# Patient Record
Sex: Female | Born: 1985 | Race: White | Hispanic: No | Marital: Married | Smoking: Never smoker
Health system: Southern US, Community
[De-identification: ages and names within clinical notes are randomized; demographics above are authoritative.]

## PROBLEM LIST (undated history)

## (undated) DIAGNOSIS — R87619 Unspecified abnormal cytological findings in specimens from cervix uteri: Secondary | ICD-10-CM

## (undated) DIAGNOSIS — F419 Anxiety disorder, unspecified: Secondary | ICD-10-CM

## (undated) DIAGNOSIS — K219 Gastro-esophageal reflux disease without esophagitis: Secondary | ICD-10-CM

## (undated) DIAGNOSIS — K859 Acute pancreatitis without necrosis or infection, unspecified: Secondary | ICD-10-CM

## (undated) HISTORY — PX: TONSILLECTOMY: SUR1361

## (undated) HISTORY — DX: Unspecified abnormal cytological findings in specimens from cervix uteri: R87.619

## (undated) HISTORY — PX: WISDOM TOOTH EXTRACTION: SHX21

---

## 2012-11-06 NOTE — L&D Delivery Note (Signed)
Operative Delivery Note At 2:08 PM a viable female was delivered via Vaginal, Vacuum Investment banker, operational).  Presentation: vertex; Position: Left,, Occiput,, Anterior; Station: +2.  Verbal consent: obtained from patient.  Risks and benefits discussed in detail.  Risks include, but are not limited to the risks of anesthesia, bleeding, infection, damage to maternal tissues, fetal cephalhematoma.  There is also the risk of inability to effect vaginal delivery of the head, or shoulder dystocia that cannot be resolved by established maneuvers, leading to the need for emergency cesarean section.  APGAR:8/9 , ; weight .   Placenta status: , .intact   Cord: 3 vessels with the following complications: None.  Cord pH: na  Anesthesia: Epidural  Instruments: mighty vac mushroom cup Episiotomy: None Lacerations: left lateral vaginal laceration Suture Repair: 2.0 chromic Est. Blood Loss (mL): 450  Mom to postpartum.  Baby to nursery-stable.  Briar Sword S 07/31/2013, 2:22 PM

## 2013-07-24 ENCOUNTER — Inpatient Hospital Stay (HOSPITAL_COMMUNITY)
Admission: AD | Admit: 2013-07-24 | Discharge: 2013-07-24 | Disposition: A | Discharge status: Home / Self Care | Payer: BC Managed Care – PPO | Source: Ambulatory Visit | Attending: Obstetrics and Gynecology | Admitting: Obstetrics and Gynecology

## 2013-07-24 ENCOUNTER — Encounter (HOSPITAL_COMMUNITY): Payer: Self-pay | Admitting: *Deleted

## 2013-07-24 DIAGNOSIS — O133 Gestational [pregnancy-induced] hypertension without significant proteinuria, third trimester: Secondary | ICD-10-CM

## 2013-07-24 DIAGNOSIS — O139 Gestational [pregnancy-induced] hypertension without significant proteinuria, unspecified trimester: Secondary | ICD-10-CM | POA: Insufficient documentation

## 2013-07-24 DIAGNOSIS — E876 Hypokalemia: Secondary | ICD-10-CM | POA: Insufficient documentation

## 2013-07-24 HISTORY — DX: Acute pancreatitis without necrosis or infection, unspecified: K85.90

## 2013-07-24 LAB — COMPREHENSIVE METABOLIC PANEL
ALT: 18 U/L (ref 0–35)
AST: 22 U/L (ref 0–37)
Albumin: 3 g/dL — ABNORMAL LOW (ref 3.5–5.2)
Alkaline Phosphatase: 111 U/L (ref 39–117)
BUN: 3 mg/dL — ABNORMAL LOW (ref 6–23)
Chloride: 99 mEq/L (ref 96–112)
Potassium: 3 mEq/L — ABNORMAL LOW (ref 3.5–5.1)
Sodium: 136 mEq/L (ref 135–145)
Total Protein: 6.2 g/dL (ref 6.0–8.3)

## 2013-07-24 LAB — URINALYSIS, ROUTINE W REFLEX MICROSCOPIC
Ketones, ur: 15 mg/dL — AB
Nitrite: NEGATIVE
Protein, ur: NEGATIVE mg/dL
Urobilinogen, UA: 0.2 mg/dL (ref 0.0–1.0)

## 2013-07-24 LAB — CBC
MCHC: 34.7 g/dL (ref 30.0–36.0)
Platelets: 196 10*3/uL (ref 150–400)
RDW: 13.9 % (ref 11.5–15.5)
WBC: 16.7 10*3/uL — ABNORMAL HIGH (ref 4.0–10.5)

## 2013-07-24 LAB — URINE MICROSCOPIC-ADD ON

## 2013-07-24 LAB — LACTATE DEHYDROGENASE: LDH: 202 U/L (ref 94–250)

## 2013-07-24 NOTE — MAU Provider Note (Signed)
History     CSN: [id number]  Arrival date and time: 07/24/13 1610   First Provider Initiated Contact with Patient 07/24/13 1713      Chief Complaint  Patient presents with  . Hypertension   HPI  Ms. Vickie Olson is a 27 y.o. female G1P0 at [redacted]w[redacted]d who presents for high blood pressure readings in the office today; she was sent over by Dr. Henderson Cloud for evaluation and lab work. She is healthy; no problems with this pregnancy. She denies headaches, right upper quadrant pain or lower extremity swelling. She is having some contractions; she was checked in the office today and her cervix was a FT.  She has had a few episodes of diarrhea and was told it was likely related to contractions or the start of labor. She reports good fetal movement, denies LOF, vaginal bleeding, vaginal itching/burning, urinary symptoms, h/a, dizziness, n/v, or fever/chills.     OB History   Grav Para Term Preterm Abortions TAB SAB Ect Mult Living   1         0      Past Medical History  Diagnosis Date  . Pancreatitis, acute     Past Surgical History  Procedure Laterality Date  . Tonsillectomy      History reviewed. No pertinent family history.  History  Substance Use Topics  . Smoking status: Never Smoker   . Smokeless tobacco: Not on file  . Alcohol Use: No    Allergies:  Allergies  Allergen Reactions  . Terconazole Other (See Comments)    Cannot take because patient has had pancreatitis  . Augmentin [Amoxicillin-Pot Clavulanate] Diarrhea    Prescriptions prior to admission  Medication Sig Dispense Refill  . acetaminophen (TYLENOL) 500 MG tablet Take 500 mg by mouth every 6 (six) hours as needed for pain.      . Prenatal Vit-Fe Fumarate-FA (PRENATAL MULTIVITAMIN) TABS tablet Take 1 tablet by mouth at bedtime.       Results for orders placed during the hospital encounter of 07/24/13 (from the past 24 hour(s))  URINALYSIS, ROUTINE W REFLEX MICROSCOPIC     Status: Abnormal   Collection Time     07/24/13  4:25 PM      Result Value Range   Color, Urine STRAW (*) YELLOW   APPearance CLEAR  CLEAR   Specific Gravity, Urine 1.010  1.005 - 1.030   pH 7.0  5.0 - 8.0   Glucose, UA NEGATIVE  NEGATIVE mg/dL   Hgb urine dipstick SMALL (*) NEGATIVE   Bilirubin Urine NEGATIVE  NEGATIVE   Ketones, ur 15 (*) NEGATIVE mg/dL   Protein, ur NEGATIVE  NEGATIVE mg/dL   Urobilinogen, UA 0.2  0.0 - 1.0 mg/dL   Nitrite NEGATIVE  NEGATIVE   Leukocytes, UA MODERATE (*) NEGATIVE  URINE MICROSCOPIC-ADD ON     Status: Abnormal   Collection Time    07/24/13  4:25 PM      Result Value Range   Squamous Epithelial / LPF FEW (*) RARE   WBC, UA 11-20  <3 WBC/hpf   Bacteria, UA FEW (*) RARE  CBC     Status: Abnormal   Collection Time    07/24/13  4:52 PM      Result Value Range   WBC 16.7 (*) 4.0 - 10.5 K/uL   RBC 4.16  3.87 - 5.11 MIL/uL   Hemoglobin 12.0  12.0 - 15.0 g/dL   HCT 78.2 (*) 95.6 - 21.3 %   MCV 83.2  78.0 - 100.0 fL   MCH 28.8  26.0 - 34.0 pg   MCHC 34.7  30.0 - 36.0 g/dL   RDW 16.1  09.6 - 04.5 %   Platelets 196  150 - 400 K/uL  COMPREHENSIVE METABOLIC PANEL     Status: Abnormal   Collection Time    07/24/13  4:52 PM      Result Value Range   Sodium 136  135 - 145 mEq/L   Potassium 3.0 (*) 3.5 - 5.1 mEq/L   Chloride 99  96 - 112 mEq/L   CO2 23  19 - 32 mEq/L   Glucose, Bld 81  70 - 99 mg/dL   BUN 3 (*) 6 - 23 mg/dL   Creatinine, Ser 4.09  0.50 - 1.10 mg/dL   Calcium 9.7  8.4 - 81.1 mg/dL   Total Protein 6.2  6.0 - 8.3 g/dL   Albumin 3.0 (*) 3.5 - 5.2 g/dL   AST 22  0 - 37 U/L   ALT 18  0 - 35 U/L   Alkaline Phosphatase 111  39 - 117 U/L   Total Bilirubin 0.4  0.3 - 1.2 mg/dL   GFR calc non Af Amer >90  >90 mL/min   GFR calc Af Amer >90  >90 mL/min  LACTATE DEHYDROGENASE     Status: None   Collection Time    07/24/13  4:52 PM      Result Value Range   LDH 202  94 - 250 U/L  URIC ACID     Status: None   Collection Time    07/24/13  4:52 PM      Result Value Range    Uric Acid, Serum 3.5  2.4 - 7.0 mg/dL    Review of Systems  Constitutional: Negative for fever, chills and weight loss.  Respiratory: Negative for shortness of breath.   Gastrointestinal: Positive for abdominal pain and diarrhea. Negative for nausea, vomiting and constipation.       Some upper abdominal tightening   Genitourinary: Negative for dysuria, urgency and frequency.   Physical Exam   Blood pressure 137/82, pulse 89, temperature 98.4 F (36.9 C), temperature source Oral, resp. rate 20, height 5\' 8"  (1.727 m), weight 75.297 kg (166 lb), SpO2 99.00%.  Physical Exam  Constitutional: She is oriented to person, place, and time. She appears well-developed and well-nourished. No distress.  Eyes: Pupils are equal, round, and reactive to light.  Neck: Neck supple.  Cardiovascular: Normal rate, regular rhythm and normal heart sounds.   Respiratory: Breath sounds normal. No respiratory distress.  GI: Soft. There is no tenderness. There is no rebound and no guarding.  No right upper abdominal pain on palpation   Musculoskeletal: She exhibits no edema and no tenderness.       Right ankle: Normal. She exhibits no swelling.       Left ankle: Normal. She exhibits no swelling.  Neurological: She is alert and oriented to person, place, and time. She has normal reflexes.  Skin: Skin is warm and dry. She is not diaphoretic.   Fetal Tracing: Baseline: 140 bpm Variability: Moderate  Accelerations: 15X15 Decelerations: none  Toco: Irregular contraction patter, UI   MAU Course  Procedures None  MDM NST CBC CMET Uric Acid LDH UA Urine culture sent  Consulted with Dr. Marcelle Overlie regarding patients lab work; ok to discharge pt home with PIH precautions.   Assessment and Plan  A: Pregnancy induced hypertension Hypokalemia   P: Discharge home Increase your  water intake; Gatorade to increase K+ levels B.R.A.T diet discussed  Labor precautions discussed  Kick counts discussed   Return to MAU if symptoms worsen Keep your regular scheduled appointment  [facility], Deziree Mokry IRENE FNP-C 07/24/2013, 8:16 PM

## 2013-07-24 NOTE — MAU Note (Signed)
Pt saw Dr. Henderson Cloud in office today and had elevated BP.  Pt sent from office for BP monitoring.

## 2013-07-26 LAB — URINE CULTURE

## 2013-07-30 ENCOUNTER — Inpatient Hospital Stay (HOSPITAL_COMMUNITY)
Admission: AD | Admit: 2013-07-30 | Discharge: 2013-07-31 | Disposition: A | Discharge status: Home / Self Care | Payer: BC Managed Care – PPO | Source: Ambulatory Visit | Attending: Obstetrics and Gynecology | Admitting: Obstetrics and Gynecology

## 2013-07-30 ENCOUNTER — Encounter (HOSPITAL_COMMUNITY): Payer: Self-pay | Admitting: *Deleted

## 2013-07-30 DIAGNOSIS — O479 False labor, unspecified: Secondary | ICD-10-CM | POA: Insufficient documentation

## 2013-07-30 LAB — OB RESULTS CONSOLE GBS: GBS: NEGATIVE

## 2013-07-30 NOTE — MAU Note (Signed)
Contractions every 5 minutes tonight. Denies leaking of fluid or vaginal bleeding. Positive fetal movement. Dilate 1.5/90/BBOW in office on Monday.

## 2013-07-31 ENCOUNTER — Inpatient Hospital Stay (HOSPITAL_COMMUNITY)
Admission: AD | Admit: 2013-07-31 | Discharge: 2013-08-02 | DRG: 373 | Disposition: A | Discharge status: Home / Self Care | Payer: BC Managed Care – PPO | Source: Ambulatory Visit | Attending: Obstetrics and Gynecology | Admitting: Obstetrics and Gynecology

## 2013-07-31 ENCOUNTER — Encounter (HOSPITAL_COMMUNITY): Payer: Self-pay | Admitting: Anesthesiology

## 2013-07-31 ENCOUNTER — Inpatient Hospital Stay (HOSPITAL_COMMUNITY): Payer: BC Managed Care – PPO | Admitting: Anesthesiology

## 2013-07-31 ENCOUNTER — Encounter (HOSPITAL_COMMUNITY): Payer: Self-pay | Admitting: *Deleted

## 2013-07-31 DIAGNOSIS — S3141XA Laceration without foreign body of vagina and vulva, initial encounter: Secondary | ICD-10-CM

## 2013-07-31 LAB — CBC
HCT: 36.1 % (ref 36.0–46.0)
Hemoglobin: 12 g/dL (ref 12.0–15.0)
Hemoglobin: 12.5 g/dL (ref 12.0–15.0)
MCHC: 34.6 g/dL (ref 30.0–36.0)
MCV: 82.8 fL (ref 78.0–100.0)
RBC: 4.24 MIL/uL (ref 3.87–5.11)
WBC: 23.3 10*3/uL — ABNORMAL HIGH (ref 4.0–10.5)

## 2013-07-31 LAB — OB RESULTS CONSOLE RPR
RPR: NONREACTIVE
RPR: NONREACTIVE
RPR: NONREACTIVE
RPR: NONREACTIVE

## 2013-07-31 LAB — URINALYSIS, ROUTINE W REFLEX MICROSCOPIC
Bilirubin Urine: NEGATIVE
Glucose, UA: NEGATIVE mg/dL
Nitrite: NEGATIVE
Specific Gravity, Urine: 1.01 (ref 1.005–1.030)
pH: 7 (ref 5.0–8.0)

## 2013-07-31 LAB — COMPREHENSIVE METABOLIC PANEL
ALT: 16 U/L (ref 0–35)
Alkaline Phosphatase: 113 U/L (ref 39–117)
CO2: 21 mEq/L (ref 19–32)
GFR calc Af Amer: >90 mL/min (ref >90)
GFR calc non Af Amer: >90 mL/min (ref >90)
Glucose, Bld: 90 mg/dL (ref 70–99)
Potassium: 3 mEq/L — ABNORMAL LOW (ref 3.5–5.1)
Sodium: 134 mEq/L — ABNORMAL LOW (ref 135–145)
Total Bilirubin: 0.4 mg/dL (ref 0.3–1.2)

## 2013-07-31 LAB — OB RESULTS CONSOLE RUBELLA ANTIBODY, IGM: Rubella: IMMUNE

## 2013-07-31 MED ORDER — FENTANYL 2.5 MCG/ML BUPIVACAINE 1/10 % EPIDURAL INFUSION (WH - ANES)
14.0000 mL/h | INTRAMUSCULAR | Status: DC | PRN
  Filled 2013-07-31: qty 125

## 2013-07-31 MED ORDER — LACTATED RINGERS IV SOLN
500.0000 mL | Freq: Once | INTRAVENOUS | Status: AC
  Administered 2013-07-31: 500 mL via INTRAVENOUS

## 2013-07-31 MED ORDER — FLEET ENEMA 7-19 GM/118ML RE ENEM: 1.0000 | ENEMA | Freq: Every day | RECTAL | Status: DC | PRN

## 2013-07-31 MED ORDER — IBUPROFEN 600 MG PO TABS
600.0000 mg | ORAL_TABLET | Freq: Four times a day (QID) | ORAL | Status: DC
  Administered 2013-07-31 – 2013-08-02 (×7): 600 mg via ORAL
  Filled 2013-07-31 (×7): qty 1

## 2013-07-31 MED ORDER — PHENYLEPHRINE 40 MCG/ML (10ML) SYRINGE FOR IV PUSH (FOR BLOOD PRESSURE SUPPORT)
80.0000 ug | PREFILLED_SYRINGE | INTRAVENOUS | Status: DC | PRN
  Administered 2013-07-31: 40 ug via INTRAVENOUS
  Filled 2013-07-31: qty 2

## 2013-07-31 MED ORDER — LIDOCAINE HCL (PF) 1 % IJ SOLN
30.0000 mL | INTRAMUSCULAR | Status: DC | PRN
  Administered 2013-07-31: 30 mL via SUBCUTANEOUS
  Filled 2013-07-31 (×2): qty 30

## 2013-07-31 MED ORDER — TETANUS-DIPHTH-ACELL PERTUSSIS 5-2.5-18.5 LF-MCG/0.5 IM SUSP: 0.5000 mL | Freq: Once | INTRAMUSCULAR | Status: DC

## 2013-07-31 MED ORDER — WITCH HAZEL-GLYCERIN EX PADS: 1.0000 "application " | MEDICATED_PAD | CUTANEOUS | Status: DC | PRN

## 2013-07-31 MED ORDER — LACTATED RINGERS IV SOLN
INTRAVENOUS | Status: DC
  Administered 2013-07-31 (×2): via INTRAVENOUS

## 2013-07-31 MED ORDER — ONDANSETRON HCL 4 MG/2ML IJ SOLN: 4.0000 mg | INTRAMUSCULAR | Status: DC | PRN

## 2013-07-31 MED ORDER — OXYTOCIN BOLUS FROM INFUSION: 500.0000 mL | INTRAVENOUS | Status: DC

## 2013-07-31 MED ORDER — OXYTOCIN 40 UNITS IN LACTATED RINGERS INFUSION - SIMPLE MED
62.5000 mL/h | INTRAVENOUS | Status: DC
  Administered 2013-07-31: 62.5 mL/h via INTRAVENOUS
  Filled 2013-07-31: qty 1000

## 2013-07-31 MED ORDER — LACTATED RINGERS IV SOLN: 500.0000 mL | INTRAVENOUS | Status: DC | PRN

## 2013-07-31 MED ORDER — OXYTOCIN 40 UNITS IN LACTATED RINGERS INFUSION - SIMPLE MED
1.0000 m[IU]/min | INTRAVENOUS | Status: DC
  Administered 2013-07-31: 2 m[IU]/min via INTRAVENOUS
  Administered 2013-07-31: 4 m[IU]/min via INTRAVENOUS

## 2013-07-31 MED ORDER — OXYCODONE-ACETAMINOPHEN 5-325 MG PO TABS: 1.0000 | ORAL_TABLET | ORAL | Status: DC | PRN

## 2013-07-31 MED ORDER — ONDANSETRON HCL 4 MG PO TABS: 4.0000 mg | ORAL_TABLET | ORAL | Status: DC | PRN

## 2013-07-31 MED ORDER — DIPHENHYDRAMINE HCL 25 MG PO CAPS: 25.0000 mg | ORAL_CAPSULE | Freq: Four times a day (QID) | ORAL | Status: DC | PRN

## 2013-07-31 MED ORDER — BISACODYL 10 MG RE SUPP: 10.0000 mg | Freq: Every day | RECTAL | Status: DC | PRN

## 2013-07-31 MED ORDER — EPHEDRINE 5 MG/ML INJ
10.0000 mg | INTRAVENOUS | Status: DC | PRN
  Filled 2013-07-31: qty 2

## 2013-07-31 MED ORDER — LANOLIN HYDROUS EX OINT: TOPICAL_OINTMENT | CUTANEOUS | Status: DC | PRN

## 2013-07-31 MED ORDER — ZOLPIDEM TARTRATE 5 MG PO TABS: 5.0000 mg | ORAL_TABLET | Freq: Every evening | ORAL | Status: DC | PRN

## 2013-07-31 MED ORDER — PRENATAL MULTIVITAMIN CH
1.0000 | ORAL_TABLET | Freq: Every day | ORAL | Status: DC
  Administered 2013-08-01: 1 via ORAL
  Filled 2013-07-31: qty 1

## 2013-07-31 MED ORDER — EPHEDRINE 5 MG/ML INJ
10.0000 mg | INTRAVENOUS | Status: DC | PRN
  Filled 2013-07-31: qty 4
  Filled 2013-07-31: qty 2

## 2013-07-31 MED ORDER — CITRIC ACID-SODIUM CITRATE 334-500 MG/5ML PO SOLN: 30.0000 mL | ORAL | Status: DC | PRN

## 2013-07-31 MED ORDER — PHENYLEPHRINE 40 MCG/ML (10ML) SYRINGE FOR IV PUSH (FOR BLOOD PRESSURE SUPPORT)
80.0000 ug | PREFILLED_SYRINGE | INTRAVENOUS | Status: DC | PRN
  Filled 2013-07-31: qty 2
  Filled 2013-07-31: qty 5

## 2013-07-31 MED ORDER — ONDANSETRON HCL 4 MG/2ML IJ SOLN: 4.0000 mg | Freq: Four times a day (QID) | INTRAMUSCULAR | Status: DC | PRN

## 2013-07-31 MED ORDER — IBUPROFEN 600 MG PO TABS: 600.0000 mg | ORAL_TABLET | Freq: Four times a day (QID) | ORAL | Status: DC | PRN

## 2013-07-31 MED ORDER — FLEET ENEMA 7-19 GM/118ML RE ENEM: 1.0000 | ENEMA | RECTAL | Status: DC | PRN

## 2013-07-31 MED ORDER — TERBUTALINE SULFATE 1 MG/ML IJ SOLN: 0.2500 mg | Freq: Once | INTRAMUSCULAR | Status: DC | PRN

## 2013-07-31 MED ORDER — ACETAMINOPHEN 325 MG PO TABS: 650.0000 mg | ORAL_TABLET | ORAL | Status: DC | PRN

## 2013-07-31 MED ORDER — SENNOSIDES-DOCUSATE SODIUM 8.6-50 MG PO TABS
2.0000 | ORAL_TABLET | ORAL | Status: DC
  Administered 2013-08-01: 2 via ORAL

## 2013-07-31 MED ORDER — SIMETHICONE 80 MG PO CHEW: 80.0000 mg | CHEWABLE_TABLET | ORAL | Status: DC | PRN

## 2013-07-31 MED ORDER — BENZOCAINE-MENTHOL 20-0.5 % EX AERO
1.0000 "application " | INHALATION_SPRAY | CUTANEOUS | Status: DC | PRN
  Administered 2013-07-31: 1 via TOPICAL
  Filled 2013-07-31: qty 56

## 2013-07-31 MED ORDER — DIBUCAINE 1 % RE OINT: 1.0000 "application " | TOPICAL_OINTMENT | RECTAL | Status: DC | PRN

## 2013-07-31 MED ORDER — DIPHENHYDRAMINE HCL 50 MG/ML IJ SOLN: 12.5000 mg | INTRAMUSCULAR | Status: DC | PRN

## 2013-07-31 NOTE — Anesthesia Preprocedure Evaluation (Signed)
Anesthesia Evaluation  Patient identified by MRN, date of birth, ID band Patient awake    Reviewed: Allergy & Precautions, H&P , NPO status , Patient's Chart, lab work & pertinent test results  Airway Mallampati: I TM Distance: >3 FB Neck ROM: full    Dental no notable dental hx.    Pulmonary neg pulmonary ROS,    Pulmonary exam normal       Cardiovascular negative cardio ROS      Neuro/Psych negative neurological ROS  negative psych ROS   GI/Hepatic negative GI ROS, Neg liver ROS,   Endo/Other  negative endocrine ROS  Renal/GU negative Renal ROS  negative genitourinary   Musculoskeletal negative musculoskeletal ROS (+)   Abdominal Normal abdominal exam  (+)   Peds  Hematology negative hematology ROS (+)   Anesthesia Other Findings   Reproductive/Obstetrics (+) Pregnancy                           Anesthesia Physical  Anesthesia Plan  ASA: II  Anesthesia Plan: Epidural   Post-op Pain Management:    Induction:   Airway Management Planned:   Additional Equipment:   Intra-op Plan:   Post-operative Plan:   Informed Consent: I have reviewed the patients History and Physical, chart, labs and discussed the procedure including the risks, benefits and alternatives for the proposed anesthesia with the patient or authorized representative who has indicated his/her understanding and acceptance.     Plan Discussed with:   Anesthesia Plan Comments:         Anesthesia Quick Evaluation  

## 2013-07-31 NOTE — MAU Note (Signed)
Dr. Renaldo Fiddler updated on BP, SVE, Lab results. Plan of care d/w Dr. Renaldo Fiddler. Pt be discharged home and f/u in office as scheduled or be rechecked and sent home if no cervical change. Pt requests to be rechecked.

## 2013-07-31 NOTE — H&P (Signed)
Vickie Olson is a 27 y.o. female presenting at 78.2 with spontaneous onset of labor.  Negative BGS Maternal Medical History:  Reason for admission: Contractions.   Contractions: Onset was 1-2 hours ago.   Frequency: irregular.   Perceived severity is strong.    Fetal activity: Perceived fetal activity is normal.    Prenatal complications: Some elevated blood pressures in office    OB History   Grav Para Term Preterm Abortions TAB SAB Ect Mult Living   1         0     Past Medical History  Diagnosis Date  . Pancreatitis, acute    Past Surgical History  Procedure Laterality Date  . Tonsillectomy     Family History: family history is not on file. Social History:  reports that she has never smoked. She does not have any smokeless tobacco history on file. She reports that she does not drink alcohol or use illicit drugs.   Prenatal Transfer Tool  Maternal Diabetes: No Genetic Screening: Normal Maternal Ultrasounds/Referrals: Normal Fetal Ultrasounds or other Referrals:  None Maternal Substance Abuse:  No Significant Maternal Medications:  None Significant Maternal Lab Results:  None Other Comments:  None  ROS  Dilation: 4 Effacement (%): 100 Station: 0 Exam by:: Dorrene German RN Blood pressure 142/81, pulse 105, temperature 97.4 F (36.3 C), temperature source Oral, resp. rate 22, height 5\' 8"  (1.727 m), weight 75.297 kg (166 lb). Maternal Exam:  Uterine Assessment: Contraction strength is firm.  Contraction frequency is regular.   Abdomen: Patient reports no abdominal tenderness. Fundal height is c/w dates.   Estimated fetal weight is 7.   Fetal presentation: vertex  Introitus: Amniotic fluid character: clear.  Pelvis: adequate for delivery.   Cervix: Cervix 5 cm 100% effacement vetex-1  Physical Exam  Prenatal labs: ABO, Rh:   Antibody:   Rubella:   RPR:    HBsAg:    HIV:    GBS: Negative (09/24 0000)   Assessment/Plan: IUP at term with spontaneous  onset of labor Routine labor and delivery   Vickie Olson S 07/31/2013, 8:30 AM

## 2013-07-31 NOTE — MAU Note (Signed)
Pt seen in MAU during the night, was 1 cm @ 0200, uc's have become more intense, denies LOF, does have bloody show.

## 2013-07-31 NOTE — Progress Notes (Signed)
Notified anesthesia of pt tachycardia, BPs, and symptoms--orders to administer 1cc of phenylephrine and continue to assess

## 2013-07-31 NOTE — Lactation Note (Signed)
This note was copied from the chart of Vickie Cris Gibby. Lactation Consultation Note Baby asleep in moms arms.  Reports previous good feedings.  Informed/Taught bfing basics,information handouts given.  Support group info provided.  Encouraged to call with questions or need for assistance.   Patient Name: Vickie Olson Today's Date: 07/31/2013 Reason for consult: Initial assessment   Maternal Data Formula Feeding for Exclusion: No Infant to breast within first hour of birth: Yes Has patient been taught Hand Expression?: Yes Does the patient have breastfeeding experience prior to this delivery?: No  Feeding Feeding Type: Breast Milk Length of feed: 0 min  LATCH Score/Interventions                      Lactation Tools Discussed/Used     Consult Status Consult Status: Follow-up Date: 08/01/13 Follow-up type: In-patient    Hansel Feinstein 07/31/2013, 8:30 PM

## 2013-08-01 LAB — CBC
HCT: 34 % — ABNORMAL LOW (ref 36.0–46.0)
Hemoglobin: 11.6 g/dL — ABNORMAL LOW (ref 12.0–15.0)
RDW: 14.5 % (ref 11.5–15.5)
WBC: 22.2 10*3/uL — ABNORMAL HIGH (ref 4.0–10.5)

## 2013-08-01 LAB — URINE CULTURE: Colony Count: >=100000

## 2013-08-01 NOTE — Progress Notes (Signed)
Patient was referred for history of depression/anxiety. * Referral screened out by Clinical Social Worker because none of the following criteria appear to apply: ~ History of anxiety/depression during this pregnancy, or of post-partum depression. ~ Diagnosis of anxiety and/or depression within last 3 years ~ History of depression due to pregnancy loss/loss of child OR * Patient's symptoms currently being treated with medication and/or therapy. Please contact the Clinical Social Worker if needs arise, or if patient requests.  PNR notes that MOB has a therapist.

## 2013-08-01 NOTE — Progress Notes (Signed)
Post Partum Day 1 Subjective: no complaints, up ad lib, voiding and tolerating PO  Objective: Blood pressure 127/83, pulse 85, temperature 97.9 F (36.6 C), temperature source Oral, resp. rate 18, height 5\' 8"  (1.727 m), weight 166 lb (75.297 kg), SpO2 97.00%, unknown if currently breastfeeding.  Physical Exam:  General: alert and cooperative Lochia: appropriate Uterine Fundus: firm Incision: perineum intact, minimal labial edema DVT Evaluation: No evidence of DVT seen on physical exam. Negative Homan's sign. No cords or calf tenderness. No significant calf/ankle edema.   Recent Labs  07/31/13 0745 08/01/13 0555  HGB 12.5 11.6*  HCT 36.1 34.0*    Assessment/Plan: Plan for discharge tomorrow   LOS: 1 day   Nickholas Goldston G 08/01/2013, 8:13 AM

## 2013-08-02 NOTE — Progress Notes (Signed)
Post Partum Day 2 Subjective: no complaints  Objective: Blood pressure 149/85, pulse 85, temperature 97.7 F (36.5 C), temperature source Oral, resp. rate 20, height 5\' 8"  (1.727 m), weight 75.297 kg (166 lb), SpO2 97.00%, unknown if currently breastfeeding.  Physical Exam:  General: alert, cooperative and appears stated age Lochia: appropriate Uterine Fundus: firm Incision: healing well, no significant drainage, no dehiscence DVT Evaluation: No evidence of DVT seen on physical exam.   Recent Labs  07/31/13 0745 08/01/13 0555  HGB 12.5 11.6*  HCT 36.1 34.0*    Assessment/Plan: Discharge home and Breastfeeding   LOS: 2 days   Ahriyah Vannest L 08/02/2013, 7:07 AM

## 2013-08-02 NOTE — Discharge Summary (Signed)
Obstetric Discharge Summary Reason for Admission: onset of labor Prenatal Procedures: none Intrapartum Procedures: vacuum Postpartum Procedures: none Complications-Operative and Postpartum: vaginal degree perineal laceration Hemoglobin  Date Value Range Status  08/01/2013 11.6* 12.0 - 15.0 g/dL Final     HCT  Date Value Range Status  08/01/2013 34.0* 36.0 - 46.0 % Final    Physical Exam:  General: alert, cooperative and appears stated age 27: appropriate Uterine Fundus: firm Incision: healing well, no significant drainage, no dehiscence DVT Evaluation: No evidence of DVT seen on physical exam.  Discharge Diagnoses: Term Pregnancy-delivered  Discharge Information: Date: 08/02/2013 Activity: pelvic rest Diet: routine Medications: None Condition: stable Instructions: refer to practice specific booklet Discharge to: home   Newborn Data: Live born female  Birth Weight: 7 lb 13 oz (3544 g) APGAR: 8, 9  Home with mother.  Kiyona Mcnall L 08/02/2013, 7:08 AM

## 2013-08-03 NOTE — Anesthesia Postprocedure Evaluation (Signed)
Anesthesia Post Note  Patient: Vickie Olson  Procedure(s) Performed: * No procedures listed *  Anesthesia type: Epidural  Patient location: Mother/Baby  Post pain: Pain level controlled  Post assessment: Post-op Vital signs reviewed  Last Vitals: There were no vitals filed for this visit.  Post vital signs: Reviewed  Level of consciousness: awake  Complications: No apparent anesthesia complications

## 2013-08-08 ENCOUNTER — Encounter (HOSPITAL_COMMUNITY)
Admission: RE | Admit: 2013-08-08 | Discharge: 2013-08-08 | Disposition: A | Discharge status: Home / Self Care | Payer: BC Managed Care – PPO | Source: Ambulatory Visit | Attending: Obstetrics & Gynecology | Admitting: Obstetrics & Gynecology

## 2013-08-08 DIAGNOSIS — O923 Agalactia: Secondary | ICD-10-CM | POA: Insufficient documentation

## 2014-08-07 LAB — OB RESULTS CONSOLE RUBELLA ANTIBODY, IGM: Rubella: IMMUNE

## 2014-08-07 LAB — OB RESULTS CONSOLE ANTIBODY SCREEN: ANTIBODY SCREEN: NEGATIVE

## 2014-08-07 LAB — OB RESULTS CONSOLE HEPATITIS B SURFACE ANTIGEN: HEP B S AG: NEGATIVE

## 2014-08-07 LAB — OB RESULTS CONSOLE HIV ANTIBODY (ROUTINE TESTING): HIV: NONREACTIVE

## 2014-08-07 LAB — OB RESULTS CONSOLE GBS: STREP GROUP B AG: POSITIVE

## 2014-08-07 LAB — OB RESULTS CONSOLE GC/CHLAMYDIA
Chlamydia: NEGATIVE
Gonorrhea: NEGATIVE

## 2014-08-07 LAB — OB RESULTS CONSOLE RPR: RPR: NONREACTIVE

## 2014-08-07 LAB — OB RESULTS CONSOLE ABO/RH: RH Type: POSITIVE

## 2014-09-07 ENCOUNTER — Encounter (HOSPITAL_COMMUNITY): Payer: Self-pay | Admitting: *Deleted

## 2014-10-16 ENCOUNTER — Other Ambulatory Visit (HOSPITAL_COMMUNITY): Payer: Self-pay | Admitting: Obstetrics & Gynecology

## 2014-10-16 DIAGNOSIS — IMO0002 Reserved for concepts with insufficient information to code with codable children: Secondary | ICD-10-CM

## 2014-10-16 DIAGNOSIS — Z0489 Encounter for examination and observation for other specified reasons: Secondary | ICD-10-CM

## 2014-10-27 ENCOUNTER — Ambulatory Visit (HOSPITAL_COMMUNITY)
Admission: RE | Admit: 2014-10-27 | Discharge: 2014-10-27 | Disposition: A | Discharge status: Home / Self Care | Payer: BC Managed Care – PPO | Source: Ambulatory Visit | Attending: Obstetrics & Gynecology | Admitting: Obstetrics & Gynecology

## 2014-10-27 ENCOUNTER — Ambulatory Visit (HOSPITAL_COMMUNITY): Admission: RE | Admit: 2014-10-27 | Payer: BC Managed Care – PPO | Source: Ambulatory Visit

## 2014-10-27 ENCOUNTER — Encounter (HOSPITAL_COMMUNITY): Payer: Self-pay

## 2014-10-27 DIAGNOSIS — O350XX0 Maternal care for (suspected) central nervous system malformation in fetus, not applicable or unspecified: Secondary | ICD-10-CM | POA: Insufficient documentation

## 2014-10-27 DIAGNOSIS — Z363 Encounter for antenatal screening for malformations: Secondary | ICD-10-CM | POA: Insufficient documentation

## 2014-10-27 DIAGNOSIS — Z36 Encounter for antenatal screening of mother: Secondary | ICD-10-CM | POA: Diagnosis not present

## 2014-10-27 DIAGNOSIS — Z3A20 20 weeks gestation of pregnancy: Secondary | ICD-10-CM | POA: Insufficient documentation

## 2014-10-27 DIAGNOSIS — O3503X0 Maternal care for (suspected) central nervous system malformation or damage in fetus, choroid plexus cysts, not applicable or unspecified: Secondary | ICD-10-CM | POA: Insufficient documentation

## 2014-10-27 DIAGNOSIS — Z0489 Encounter for examination and observation for other specified reasons: Secondary | ICD-10-CM

## 2014-10-27 DIAGNOSIS — IMO0002 Reserved for concepts with insufficient information to code with codable children: Secondary | ICD-10-CM

## 2014-10-28 ENCOUNTER — Other Ambulatory Visit (HOSPITAL_COMMUNITY): Payer: Self-pay

## 2014-11-06 NOTE — L&D Delivery Note (Signed)
Delivery Note  SVD viable female Apgars 9,9 over intact perineum.  Placenta delivered spontaneously intact with 3VC. good support and hemostasis noted and R/V exam confirms.  PH art was sent.  [place] cord blood was not done.  Mother and baby were doing well.  EBL 100cc  Candice Campavid Javionna Leder, MD

## 2015-02-25 ENCOUNTER — Encounter (HOSPITAL_COMMUNITY): Payer: Self-pay | Admitting: *Deleted

## 2015-02-25 ENCOUNTER — Inpatient Hospital Stay (HOSPITAL_COMMUNITY)
Admission: AD | Admit: 2015-02-25 | Discharge: 2015-02-26 | Disposition: A | Discharge status: Home / Self Care | Payer: PRIVATE HEALTH INSURANCE | Source: Ambulatory Visit | Attending: Obstetrics and Gynecology | Admitting: Obstetrics and Gynecology

## 2015-02-25 DIAGNOSIS — O9989 Other specified diseases and conditions complicating pregnancy, childbirth and the puerperium: Secondary | ICD-10-CM | POA: Diagnosis present

## 2015-02-25 DIAGNOSIS — Z3A37 37 weeks gestation of pregnancy: Secondary | ICD-10-CM | POA: Insufficient documentation

## 2015-02-25 DIAGNOSIS — O133 Gestational [pregnancy-induced] hypertension without significant proteinuria, third trimester: Secondary | ICD-10-CM | POA: Diagnosis not present

## 2015-02-25 HISTORY — DX: Anxiety disorder, unspecified: F41.9

## 2015-02-25 HISTORY — DX: Gastro-esophageal reflux disease without esophagitis: K21.9

## 2015-02-25 LAB — CBC
HCT: 35.5 % — ABNORMAL LOW (ref 36.0–46.0)
Hemoglobin: 12.3 g/dL (ref 12.0–15.0)
MCH: 28 pg (ref 26.0–34.0)
MCHC: 34.6 g/dL (ref 30.0–36.0)
MCV: 80.9 fL (ref 78.0–100.0)
PLATELETS: 188 10*3/uL (ref 150–400)
RBC: 4.39 MIL/uL (ref 3.87–5.11)
RDW: 14 % (ref 11.5–15.5)
WBC: 13.5 10*3/uL — ABNORMAL HIGH (ref 4.0–10.5)

## 2015-02-25 LAB — COMPREHENSIVE METABOLIC PANEL
ALT: 21 U/L (ref 0–35)
ANION GAP: 10 (ref 5–15)
AST: 28 U/L (ref 0–37)
Albumin: 3.4 g/dL — ABNORMAL LOW (ref 3.5–5.2)
Alkaline Phosphatase: 103 U/L (ref 39–117)
BILIRUBIN TOTAL: 0.5 mg/dL (ref 0.3–1.2)
BUN: 6 mg/dL (ref 6–23)
CHLORIDE: 102 mmol/L (ref 96–112)
CO2: 22 mmol/L (ref 19–32)
Calcium: 8.9 mg/dL (ref 8.4–10.5)
Creatinine, Ser: 0.5 mg/dL (ref 0.50–1.10)
GFR calc Af Amer: >90 mL/min (ref >90)
GFR calc non Af Amer: >90 mL/min (ref >90)
GLUCOSE: 91 mg/dL (ref 70–99)
Potassium: 3.6 mmol/L (ref 3.5–5.1)
SODIUM: 134 mmol/L — AB (ref 135–145)
TOTAL PROTEIN: 6.8 g/dL (ref 6.0–8.3)

## 2015-02-25 LAB — LACTATE DEHYDROGENASE: LDH: 155 U/L (ref 94–250)

## 2015-02-25 LAB — POCT FERN TEST: POCT Fern Test: NEGATIVE

## 2015-02-25 LAB — URIC ACID: URIC ACID, SERUM: 3.5 mg/dL (ref 2.4–7.0)

## 2015-02-25 NOTE — MAU Note (Signed)
Contractions every 4-10 minutes. Denies of bright red vaginal bleeding.  Positive fetal movement.  Complains of SROM/LOF  Denies any Complications of pregnancy  GBS positive per patient

## 2015-02-25 NOTE — MAU Provider Note (Signed)
Chief Complaint:  Labor Eval   None     HPI: Vickie Olson is a 29 y.o. G2P1001 at [redacted]w[redacted]d who presents to maternity admissions reporting leakage of clear fluid ~2 hours ago, enough to soak her underwear and onto her clothes.  This was followed by onset of contractions every 4-10 minutes described as mild, intermittent, cramping, and lasting 1 minute each.  She does report some urine leakage this pregnancy so she is unsure if her water is broken.  She reports a few elevated BPs in the office but attributes them to white coat syndrome. She has been taking her BP at home which has been normal, last BP 109/80 at 2:30 pm today.  She reports good fetal movement, denies h/a, epigastric pain, visual disturbances, vaginal bleeding, vaginal itching/burning, urinary symptoms, dizziness, n/v, or fever/chills.     Past Medical History: Past Medical History  Diagnosis Date  . Pancreatitis, acute   . Anxiety   . GERD (gastroesophageal reflux disease)     Past obstetric history: OB History  Gravida Para Term Preterm AB SAB TAB Ectopic Multiple Living  # Outcome Date GA Lbr Len/2nd Weight Sex Delivery Anes PTL Lv  2 Current           1 Term 07/31/13 [redacted]w[redacted]d  3.544 kg (7 lb 13 oz) F Vag-Vacuum EPI  Y      Past Surgical History: Past Surgical History  Procedure Laterality Date  . Tonsillectomy    . Wisdom tooth extraction      Family History: No family history on file.  Social History: History  Substance Use Topics  . Smoking status: Never Smoker   . Smokeless tobacco: Never Used  . Alcohol Use: No    Allergies:  Allergies  Allergen Reactions  . Terconazole Other (See Comments)    Cannot take because patient has had pancreatitis  . Augmentin [Amoxicillin-Pot Clavulanate] Diarrhea    Meds:  Prescriptions prior to admission  Medication Sig Dispense Refill Last Dose  . acetaminophen (TYLENOL) 325 MG tablet Take 650 mg by mouth every 6 (six) hours as needed for  headache.   02/25/2015 at Unknown time  . calcium carbonate (TUMS - DOSED IN MG ELEMENTAL CALCIUM) 500 MG chewable tablet Chew 2-3 tablets by mouth as needed for indigestion or heartburn. Patient takes up to 10 tablets daily.   02/25/2015 at Unknown time  . loratadine (CLARITIN) 10 MG tablet Take 10 mg by mouth daily.   02/25/2015 at Unknown time  . Prenatal Vit-Fe Fumarate-FA (PRENATAL MULTIVITAMIN) TABS tablet Take 1 tablet by mouth at bedtime.   02/25/2015 at Unknown time  . sertraline (ZOLOFT) 50 MG tablet Take 50 mg by mouth daily.   02/25/2015 at Unknown time    ROS: Pertinent findings in history of present illness.  Physical Exam  Blood pressure 126/81, pulse 105, temperature 98.2 F (36.8 C), temperature source Oral, resp. rate 20, height  (1.676 m), weight 84.936 kg (187 lb 4 oz), unknown if currently breastfeeding.   Patient Vitals for the past 24 hrs:  BP Temp Temp src Pulse Resp Height Weight  02/25/15 2332 126/81 mmHg - - 105 - - -  02/25/15 2323 149/74 mmHg - - 102 - - -  02/25/15 2318 91/55 mmHg - - 116 - - -  02/25/15 2302 135/70 mmHg - - 88 - - -  02/25/15 2247 137/78 mmHg - - 97 - - -  02/25/15 2234 138/84 mmHg - - 96 - - -  02/25/15 2232 158/80 mmHg - - 106 - - -  02/25/15 2217 127/75 mmHg - - 105 - - -  02/25/15 2212 153/76 mmHg - - 104 - - -  02/25/15 2147 146/77 mmHg - - 118 - - -  02/25/15 2132 152/78 mmHg - - 100 - - -  02/25/15 2117 148/71 mmHg - - 96 - - -  02/25/15 2113 138/80 mmHg - - 108 - - -  02/25/15 2110 145/84 mmHg - - 111 - - -  02/25/15 2049 142/88 mmHg 98.2 F (36.8 C) Oral 113 20 5\' 6"  (1.676 m) 84.936 kg (187 lb 4 oz)   GENERAL: Well-developed, well-nourished female in no acute distress.  HEENT: normocephalic HEART: normal rate RESP: normal effort ABDOMEN: Soft, non-tender, gravid appropriate for gestational age EXTREMITIES: Nontender, no edema NEURO: alert and oriented  SSE with negative pooling with valsalva  Ferning slide  negative  Dilation: 1 Effacement (%): 80 Cervical Position: Middle Station: -2 Presentation: Vertex Exam by:: Judie Petitunbar RN   FHT:  Baseline 135, moderate variability, accelerations present, no decelerations Contractions: 2-4 minutes, mild to palpation   Labs: Results for orders placed or performed during the hospital encounter of 02/25/15 (from the past 24 hour(s))  Protein / creatinine ratio, urine     Status: None   Collection Time: 02/25/15  8:45 PM  Result Value Ref Range   Creatinine, Urine 32.00 mg/dL   Total Protein, Urine <6.00 mg/dL   Protein Creatinine Ratio        0.00 - 0.15  Fern Test     Status: None   Collection Time: 02/25/15  9:27 PM  Result Value Ref Range   POCT Fern Test Negative = intact amniotic membranes   CBC     Status: Abnormal   Collection Time: 02/25/15  9:58 PM  Result Value Ref Range   WBC 13.5 (H) 4.0 - 10.5 K/uL   RBC 4.39 3.87 - 5.11 MIL/uL   Hemoglobin 12.3 12.0 - 15.0 g/dL   HCT 09.835.5 (L) 11.936.0 - 14.746.0 %   MCV 80.9 78.0 - 100.0 fL   MCH 28.0 26.0 - 34.0 pg   MCHC 34.6 30.0 - 36.0 g/dL   RDW 82.914.0 56.211.5 - 13.015.5 %   Platelets 188 150 - 400 K/uL  Comprehensive metabolic panel     Status: Abnormal   Collection Time: 02/25/15  9:58 PM  Result Value Ref Range   Sodium 134 (L) 135 - 145 mmol/L   Potassium 3.6 3.5 - 5.1 mmol/L   Chloride 102 96 - 112 mmol/L   CO2 22 19 - 32 mmol/L   Glucose, Bld 91 70 - 99 mg/dL   BUN 6 6 - 23 mg/dL   Creatinine, Ser 8.650.50 0.50 - 1.10 mg/dL   Calcium 8.9 8.4 - 78.410.5 mg/dL   Total Protein 6.8 6.0 - 8.3 g/dL   Albumin 3.4 (L) 3.5 - 5.2 g/dL   AST 28 0 - 37 U/L   ALT 21 0 - 35 U/L   Alkaline Phosphatase 103 39 - 117 U/L   Total Bilirubin 0.5 0.3 - 1.2 mg/dL   GFR calc non Af Amer >90 >90 mL/min   GFR calc Af Amer >90 >90 mL/min   Anion gap 10 5 - 15  Uric acid     Status: None   Collection Time: 02/25/15  9:58 PM  Result Value Ref Range   Uric Acid, Serum 3.5  2.4 - 7.0 mg/dL  Lactate dehydrogenase     Status:  None   Collection Time: 02/25/15  9:58 PM  Result Value Ref Range   LDH 155 94 - 250 U/L   Report to Thressa Sheller, CNM, with preeclampsia labs pending 0016: D/W Dr. Corinna Lines for dc home. FU in the office on Monday for BP check.    Assessment and plan:  1. Transient hypertension of pregnancy in third trimester   2. Gestational hypertension w/o significant proteinuria in 3rd trimester    DC home Pre-eclampsia danger signs reviewed Return to MAU as needed FU in the office on Monday for B/P check  Follow-up Information    Follow up with Jeani Hawking, MD.   Specialty:  Obstetrics and Gynecology   Why:  Call for an appointment for a blood pressure check on [facility]    Contact information:   [address] [place] [address] [phone]      Tawnya Crook 02/26/2015 12:21 AM   Sharen Counter Certified Nurse-Midwife 02/26/2015 12:13 AM

## 2015-02-25 NOTE — MAU Note (Addendum)
PT  SAYS SHE THINKS   SROM AT 7PM-  WHILE SHE WAS BENDING  OVER- FELT FLUID  RAN OUT.    CONTINUES  TO RUN .   HAS HAD UC  ALL DAY.  [facility]-  DR  MORRIS. VE IN OFFICE  LAST  Friday   2 CM.  DENIES HSV AND MRSA.  GBS-  POSITIVE.    NO PAD ON

## 2015-02-26 DIAGNOSIS — O133 Gestational [pregnancy-induced] hypertension without significant proteinuria, third trimester: Secondary | ICD-10-CM

## 2015-02-26 LAB — PROTEIN / CREATININE RATIO, URINE: CREATININE, URINE: 32 mg/dL

## 2015-02-26 NOTE — Discharge Instructions (Signed)

## 2015-03-05 ENCOUNTER — Inpatient Hospital Stay (HOSPITAL_COMMUNITY): Payer: PRIVATE HEALTH INSURANCE | Admitting: Anesthesiology

## 2015-03-05 ENCOUNTER — Inpatient Hospital Stay (HOSPITAL_COMMUNITY)
Admission: AD | Admit: 2015-03-05 | Discharge: 2015-03-08 | DRG: 775 | Disposition: A | Discharge status: Home / Self Care | Payer: PRIVATE HEALTH INSURANCE | Source: Ambulatory Visit | Attending: Obstetrics and Gynecology | Admitting: Obstetrics and Gynecology

## 2015-03-05 ENCOUNTER — Encounter (HOSPITAL_COMMUNITY): Payer: Self-pay

## 2015-03-05 DIAGNOSIS — O99344 Other mental disorders complicating childbirth: Secondary | ICD-10-CM | POA: Diagnosis present

## 2015-03-05 DIAGNOSIS — O471 False labor at or after 37 completed weeks of gestation: Secondary | ICD-10-CM | POA: Diagnosis present

## 2015-03-05 DIAGNOSIS — Z3A38 38 weeks gestation of pregnancy: Secondary | ICD-10-CM | POA: Diagnosis present

## 2015-03-05 DIAGNOSIS — O9962 Diseases of the digestive system complicating childbirth: Principal | ICD-10-CM | POA: Diagnosis present

## 2015-03-05 DIAGNOSIS — IMO0001 Reserved for inherently not codable concepts without codable children: Secondary | ICD-10-CM

## 2015-03-05 DIAGNOSIS — K219 Gastro-esophageal reflux disease without esophagitis: Secondary | ICD-10-CM | POA: Diagnosis present

## 2015-03-05 DIAGNOSIS — O99824 Streptococcus B carrier state complicating childbirth: Secondary | ICD-10-CM | POA: Diagnosis present

## 2015-03-05 DIAGNOSIS — F419 Anxiety disorder, unspecified: Secondary | ICD-10-CM | POA: Diagnosis present

## 2015-03-05 LAB — TYPE AND SCREEN
ABO/RH(D): A POS
Antibody Screen: NEGATIVE

## 2015-03-05 LAB — CBC
HCT: 37 % (ref 36.0–46.0)
HEMOGLOBIN: 12.9 g/dL (ref 12.0–15.0)
MCH: 28.2 pg (ref 26.0–34.0)
MCHC: 34.9 g/dL (ref 30.0–36.0)
MCV: 80.8 fL (ref 78.0–100.0)
PLATELETS: 226 10*3/uL (ref 150–400)
RBC: 4.58 MIL/uL (ref 3.87–5.11)
RDW: 14.2 % (ref 11.5–15.5)
WBC: 17.4 10*3/uL — ABNORMAL HIGH (ref 4.0–10.5)

## 2015-03-05 MED ORDER — CITRIC ACID-SODIUM CITRATE 334-500 MG/5ML PO SOLN: 30.0000 mL | ORAL | Status: DC | PRN

## 2015-03-05 MED ORDER — LIDOCAINE HCL (PF) 1 % IJ SOLN
30.0000 mL | INTRAMUSCULAR | Status: DC | PRN
  Filled 2015-03-05: qty 30

## 2015-03-05 MED ORDER — ONDANSETRON HCL 4 MG/2ML IJ SOLN: 4.0000 mg | Freq: Four times a day (QID) | INTRAMUSCULAR | Status: DC | PRN

## 2015-03-05 MED ORDER — EPHEDRINE 5 MG/ML INJ
10.0000 mg | INTRAVENOUS | Status: DC | PRN
  Filled 2015-03-05: qty 2

## 2015-03-05 MED ORDER — LACTATED RINGERS IV SOLN
INTRAVENOUS | Status: DC
  Administered 2015-03-05 (×2): via INTRAVENOUS

## 2015-03-05 MED ORDER — PHENYLEPHRINE 40 MCG/ML (10ML) SYRINGE FOR IV PUSH (FOR BLOOD PRESSURE SUPPORT)
80.0000 ug | PREFILLED_SYRINGE | INTRAVENOUS | Status: DC | PRN
  Filled 2015-03-05: qty 20
  Filled 2015-03-05: qty 2

## 2015-03-05 MED ORDER — FENTANYL 2.5 MCG/ML BUPIVACAINE 1/10 % EPIDURAL INFUSION (WH - ANES)
14.0000 mL/h | INTRAMUSCULAR | Status: DC | PRN
  Administered 2015-03-05: 12 mL/h via EPIDURAL
  Administered 2015-03-05: 14 mL/h via EPIDURAL
  Filled 2015-03-05: qty 125

## 2015-03-05 MED ORDER — OXYCODONE-ACETAMINOPHEN 5-325 MG PO TABS: 1.0000 | ORAL_TABLET | ORAL | Status: DC | PRN

## 2015-03-05 MED ORDER — CLINDAMYCIN PHOSPHATE 900 MG/50ML IV SOLN
900.0000 mg | Freq: Three times a day (TID) | INTRAVENOUS | Status: DC
  Administered 2015-03-05: 900 mg via INTRAVENOUS
  Filled 2015-03-05 (×3): qty 50

## 2015-03-05 MED ORDER — ACETAMINOPHEN 325 MG PO TABS: 650.0000 mg | ORAL_TABLET | ORAL | Status: DC | PRN

## 2015-03-05 MED ORDER — LIDOCAINE-EPINEPHRINE (PF) 2 %-1:200000 IJ SOLN
INTRAMUSCULAR | Status: DC | PRN
  Administered 2015-03-05: 3 mL

## 2015-03-05 MED ORDER — BUPIVACAINE HCL (PF) 0.25 % IJ SOLN
INTRAMUSCULAR | Status: DC | PRN
  Administered 2015-03-05 (×2): 4 mL via EPIDURAL

## 2015-03-05 MED ORDER — LACTATED RINGERS IV SOLN: 500.0000 mL | INTRAVENOUS | Status: DC | PRN

## 2015-03-05 MED ORDER — OXYCODONE-ACETAMINOPHEN 5-325 MG PO TABS: 2.0000 | ORAL_TABLET | ORAL | Status: DC | PRN

## 2015-03-05 MED ORDER — OXYTOCIN 40 UNITS IN LACTATED RINGERS INFUSION - SIMPLE MED
62.5000 mL/h | INTRAVENOUS | Status: DC
  Administered 2015-03-06: 62.5 mL/h via INTRAVENOUS
  Filled 2015-03-05: qty 1000

## 2015-03-05 MED ORDER — OXYTOCIN BOLUS FROM INFUSION
500.0000 mL | INTRAVENOUS | Status: DC
  Administered 2015-03-06: 500 mL via INTRAVENOUS

## 2015-03-05 MED ORDER — DIPHENHYDRAMINE HCL 50 MG/ML IJ SOLN: 12.5000 mg | INTRAMUSCULAR | Status: DC | PRN

## 2015-03-05 MED ORDER — FLEET ENEMA 7-19 GM/118ML RE ENEM: 1.0000 | ENEMA | RECTAL | Status: DC | PRN

## 2015-03-05 NOTE — H&P (Signed)
Vickie Olson is a 29 y.o. female presenting for labor now 7 cm.  Preg complicated by abnl 1st tri screen but normal NIPT.  GBS+. History OB History    Gravida Para Term Preterm AB TAB SAB Ectopic Multiple Living   2 1 1       1      Past Medical History  Diagnosis Date  . Pancreatitis, acute   . Anxiety   . GERD (gastroesophageal reflux disease)    Past Surgical History  Procedure Laterality Date  . Tonsillectomy    . Wisdom tooth extraction     Family History: family history is not on file. Social History:  reports that she has never smoked. She has never used smokeless tobacco. She reports that she does not drink alcohol or use illicit drugs.   Prenatal Transfer Tool  Maternal Diabetes: No Genetic Screening: Normal Maternal Ultrasounds/Referrals: Normal Fetal Ultrasounds or other Referrals:  None Maternal Substance Abuse:  No Significant Maternal Medications:  None Significant Maternal Lab Results:  Lab values include: Group B Strep positive Other Comments:  None  ROS  Dilation: 7 Effacement (%): 100 Station: 0 Exam by:: C. Blackstock, RN Blood pressure 142/80, pulse 104, temperature 98.2 F (36.8 C), temperature source Oral, resp. rate 18, height 5\' 9"  (1.753 m), weight 186 lb 12.8 oz (84.732 kg), unknown if currently breastfeeding. Exam Physical Exam  Prenatal labs: ABO, Rh: --/--/A POS (04/29 2025) Antibody: NEG (04/29 2025) Rubella: Immune (10/02 0000) RPR: Nonreactive (10/02 0000)  HBsAg: Negative (10/02 0000)  HIV: Non-reactive (10/02 0000)  GBS: Positive (10/02 0000)   Assessment/Plan: IUP at term Abx for GBS Anticipate SVD   Candis Kabel C 03/05/2015, 11:19 PM

## 2015-03-05 NOTE — MAU Note (Signed)
[facility] Ok for pt to come to [facility]

## 2015-03-05 NOTE — MAU Note (Signed)
Was seen in office at 1030. Dx with uti today. Started having cramping at 1300 and then slowed. Went for walk and cramping got worse. STrong ctxs since 1700. Has not started antibiotics yet. Denies LOF or bleeding

## 2015-03-05 NOTE — Anesthesia Procedure Notes (Signed)
Epidural Patient location during procedure: OB  Staffing Anesthesiologist: Peighton Mehra, CHRIS Performed by: anesthesiologist   Preanesthetic Checklist Completed: patient identified, surgical consent, pre-op evaluation, timeout performed, IV checked, risks and benefits discussed and monitors and equipment checked  Epidural Patient position: sitting Prep: site prepped and draped and DuraPrep Patient monitoring: heart rate, cardiac monitor, continuous pulse ox and blood pressure Approach: midline Location: L3-L4 Injection technique: LOR saline  Needle:  Needle type: Tuohy  Needle gauge: 17 G Needle length: 9 cm Needle insertion depth: 5 cm Catheter type: closed end flexible Catheter size: 19 Gauge Catheter at skin depth: 12 cm Test dose: negative and 2% lidocaine with Epi 1:200 K  Assessment Events: blood not aspirated, injection not painful, no injection resistance, negative IV test and no paresthesia  Additional Notes H+P and labs checked, risks and benefits discussed with the patient, consent obtained, procedure tolerated well and without complications.  Reason for block:procedure for pain   

## 2015-03-05 NOTE — MAU Note (Signed)
Notified Dr. Rana SnareLowe notified patient G2P1 385w5d contractions every 2 to 3, cervix 4/90/-1 intact, GBS+, received orders.

## 2015-03-05 NOTE — Anesthesia Preprocedure Evaluation (Signed)
Anesthesia Evaluation  Patient identified by MRN, date of birth, ID band Patient awake    Reviewed: Allergy & Precautions, NPO status , Patient's Chart, lab work & pertinent test results  History of Anesthesia Complications Negative for: history of anesthetic complications  Airway Mallampati: II  TM Distance: >3 FB Neck ROM: Full    Dental  (+) Teeth Intact   Pulmonary neg pulmonary ROS,  breath sounds clear to auscultation        Cardiovascular negative cardio ROS  Rhythm:Regular     Neuro/Psych PSYCHIATRIC DISORDERS Anxiety negative neurological ROS     GI/Hepatic Neg liver ROS, GERD-  ,  Endo/Other  negative endocrine ROS  Renal/GU negative Renal ROS     Musculoskeletal   Abdominal   Peds  Hematology negative hematology ROS (+)   Anesthesia Other Findings   Reproductive/Obstetrics (+) Pregnancy                             Anesthesia Physical Anesthesia Plan  ASA: II  Anesthesia Plan: Epidural   Post-op Pain Management:    Induction:   Airway Management Planned:   Additional Equipment:   Intra-op Plan:   Post-operative Plan:   Informed Consent: I have reviewed the patients History and Physical, chart, labs and discussed the procedure including the risks, benefits and alternatives for the proposed anesthesia with the patient or authorized representative who has indicated his/her understanding and acceptance.     Plan Discussed with: Anesthesiologist  Anesthesia Plan Comments:         Anesthesia Quick Evaluation

## 2015-03-06 ENCOUNTER — Encounter (HOSPITAL_COMMUNITY): Payer: Self-pay | Admitting: *Deleted

## 2015-03-06 LAB — CBC
HCT: 34.3 % — ABNORMAL LOW (ref 36.0–46.0)
Hemoglobin: 11.5 g/dL — ABNORMAL LOW (ref 12.0–15.0)
MCH: 27.2 pg (ref 26.0–34.0)
MCHC: 33.5 g/dL (ref 30.0–36.0)
MCV: 81.1 fL (ref 78.0–100.0)
Platelets: 205 10*3/uL (ref 150–400)
RBC: 4.23 MIL/uL (ref 3.87–5.11)
RDW: 14.3 % (ref 11.5–15.5)
WBC: 21 10*3/uL — ABNORMAL HIGH (ref 4.0–10.5)

## 2015-03-06 LAB — RPR: RPR Ser Ql: NONREACTIVE

## 2015-03-06 LAB — ABO/RH: ABO/RH(D): A POS

## 2015-03-06 MED ORDER — IBUPROFEN 600 MG PO TABS
600.0000 mg | ORAL_TABLET | Freq: Four times a day (QID) | ORAL | Status: DC
  Administered 2015-03-06 – 2015-03-08 (×7): 600 mg via ORAL
  Filled 2015-03-06 (×9): qty 1

## 2015-03-06 MED ORDER — DIPHENHYDRAMINE HCL 25 MG PO CAPS: 25.0000 mg | ORAL_CAPSULE | Freq: Four times a day (QID) | ORAL | Status: DC | PRN

## 2015-03-06 MED ORDER — TETANUS-DIPHTH-ACELL PERTUSSIS 5-2.5-18.5 LF-MCG/0.5 IM SUSP: 0.5000 mL | Freq: Once | INTRAMUSCULAR | Status: DC

## 2015-03-06 MED ORDER — OXYCODONE-ACETAMINOPHEN 5-325 MG PO TABS: 1.0000 | ORAL_TABLET | ORAL | Status: DC | PRN

## 2015-03-06 MED ORDER — BENZOCAINE-MENTHOL 20-0.5 % EX AERO: 1.0000 "application " | INHALATION_SPRAY | CUTANEOUS | Status: DC | PRN

## 2015-03-06 MED ORDER — MEASLES, MUMPS & RUBELLA VAC ~~LOC~~ INJ
0.5000 mL | INJECTION | Freq: Once | SUBCUTANEOUS | Status: DC
  Filled 2015-03-06: qty 0.5

## 2015-03-06 MED ORDER — ACETAMINOPHEN 325 MG PO TABS: 650.0000 mg | ORAL_TABLET | ORAL | Status: DC | PRN

## 2015-03-06 MED ORDER — WITCH HAZEL-GLYCERIN EX PADS: 1.0000 "application " | MEDICATED_PAD | CUTANEOUS | Status: DC | PRN

## 2015-03-06 MED ORDER — ONDANSETRON HCL 4 MG PO TABS: 4.0000 mg | ORAL_TABLET | ORAL | Status: DC | PRN

## 2015-03-06 MED ORDER — SIMETHICONE 80 MG PO CHEW: 80.0000 mg | CHEWABLE_TABLET | ORAL | Status: DC | PRN

## 2015-03-06 MED ORDER — PRENATAL MULTIVITAMIN CH
1.0000 | ORAL_TABLET | Freq: Every day | ORAL | Status: DC
  Filled 2015-03-06: qty 1

## 2015-03-06 MED ORDER — ONDANSETRON HCL 4 MG/2ML IJ SOLN
4.0000 mg | INTRAMUSCULAR | Status: DC | PRN
Start: 2015-03-06 — End: 2015-03-08

## 2015-03-06 MED ORDER — DIBUCAINE 1 % RE OINT: 1.0000 "application " | TOPICAL_OINTMENT | RECTAL | Status: DC | PRN

## 2015-03-06 MED ORDER — ZOLPIDEM TARTRATE 5 MG PO TABS: 5.0000 mg | ORAL_TABLET | Freq: Every evening | ORAL | Status: DC | PRN

## 2015-03-06 MED ORDER — SENNOSIDES-DOCUSATE SODIUM 8.6-50 MG PO TABS
2.0000 | ORAL_TABLET | ORAL | Status: DC
Start: 2015-03-07 — End: 2015-03-08
  Filled 2015-03-06: qty 2

## 2015-03-06 MED ORDER — MEDROXYPROGESTERONE ACETATE 150 MG/ML IM SUSP: 150.0000 mg | INTRAMUSCULAR | Status: DC | PRN

## 2015-03-06 MED ORDER — SERTRALINE HCL 50 MG PO TABS
50.0000 mg | ORAL_TABLET | Freq: Every day | ORAL | Status: DC
  Filled 2015-03-06 (×3): qty 1

## 2015-03-06 MED ORDER — OXYCODONE-ACETAMINOPHEN 5-325 MG PO TABS: 2.0000 | ORAL_TABLET | ORAL | Status: DC | PRN

## 2015-03-06 MED ORDER — LANOLIN HYDROUS EX OINT: TOPICAL_OINTMENT | CUTANEOUS | Status: DC | PRN

## 2015-03-06 NOTE — Progress Notes (Signed)

## 2015-03-06 NOTE — Progress Notes (Signed)
At 1720 pt had BP 140/89, reassessment one hour later @ 1842 BP= 148/89, declines HA, seeing spots, blurred vision. Pt states she gets anxious at times when we are monitoring her BP.  Notified Dr. Rana SnareLowe no further orders at this time.

## 2015-03-06 NOTE — Anesthesia Postprocedure Evaluation (Signed)
Anesthesia Post Note  Patient: Vickie Olson  Procedure(s) Performed: * No procedures listed *  Anesthesia type: Epidural  Patient location: Mother/Baby  Post pain: Pain level controlled  Post assessment: Post-op Vital signs reviewed  Last Vitals:  Filed Vitals:   03/06/15 0307  BP: 129/63  Pulse: 104  Temp: 36.9 C  Resp: 20    Post vital signs: Reviewed  Level of consciousness: awake  Complications: No apparent anesthesia complications

## 2015-03-06 NOTE — Progress Notes (Signed)
Post Partum Day 0 Subjective: no complaints, up ad lib, voiding, tolerating PO and + flatus  Objective: Blood pressure 133/76, pulse 103, temperature 98.1 F (36.7 C), temperature source Oral, resp. rate 20, height 5\' 9"  (1.753 m), weight 186 lb 12.8 oz (84.732 kg), SpO2 100 %, unknown if currently breastfeeding.  Physical Exam:  General: alert, cooperative, appears stated age and no distress Lochia: appropriate Uterine Fundus: firm Incision: healing well DVT Evaluation: No evidence of DVT seen on physical exam.   Recent Labs  03/05/15 2025 03/06/15 0550  HGB 12.9 11.5*  HCT 37.0 34.3*    Assessment/Plan: Plan for discharge tomorrow and Breastfeeding   LOS: 1 day   Enda Santo C 03/06/2015, 10:08 AM

## 2015-03-07 NOTE — Lactation Note (Addendum)
This note was copied from the chart of Boy Bridgett LarssonSarah Davidoff. Lactation Consultation Note' Experienced BF mom  Reports baby just finished feeding for 15 min, Reports he wasn't sucking much yesterday so she gave him a pacifier and now he is sucking better at the breast. Room full of visitors. No questions at present. BF brochure given with resources for support after DC. Reviewed BFSG and OP appointments as resources for support.To call prn  Patient Name: [name] ZOXWR'UToday's Date: 03/07/2015 Reason for consult: Initial assessment   Maternal Data Formula Feeding for Exclusion: No Does the patient have breastfeeding experience prior to this delivery?: Yes  Feeding   LATCH Score/Interventions                      Lactation Tools Discussed/Used     Consult Status Consult Status: PRN    Pamelia HoitWeeks, Dawnmarie Breon D 03/07/2015, 11:27 AM

## 2015-03-07 NOTE — Progress Notes (Signed)
Post Partum Day 1 Subjective: no complaints, up ad lib, voiding, tolerating PO and + flatus  Objective: Blood pressure 143/82, pulse 78, temperature 97.5 F (36.4 C), temperature source Oral, resp. rate 18, height 5\' 9"  (1.753 m), weight 186 lb 12.8 oz (84.732 kg), SpO2 100 %, unknown if currently breastfeeding.  Physical Exam:  General: alert, cooperative, appears stated age and no distress Lochia: appropriate Uterine Fundus: firm Incision: healing well DVT Evaluation: No evidence of DVT seen on physical exam.   Recent Labs  03/05/15 2025 03/06/15 0550  HGB 12.9 11.5*  HCT 37.0 34.3*    Assessment/Plan: Plan for discharge tomorrow and Breastfeeding  Bilirubin issues with newborn   LOS: 2 days   Vickie Olson C 03/07/2015, 9:59 AM

## 2015-03-08 MED ORDER — IBUPROFEN 600 MG PO TABS: 600.0000 mg | ORAL_TABLET | Freq: Four times a day (QID) | ORAL | Status: AC

## 2015-03-08 NOTE — Lactation Note (Signed)
This note was copied from the chart of Boy Bridgett LarssonSarah Bellavance. Lactation Consultation Note  Patient Name: Boy Bridgett LarssonSarah Parsley ZOXWR'UToday's Date: 03/08/2015 Reason for consult: Follow-up assessment  Baby is 57 hours old at 9% weight loss, BF range - 10 -30 mins , about every 1 -2 1/2 hours  Latch Score - 8-9  14 wets , 10 stools ( yellow 0  @47  hrs. Bili check - 3.5  Per mom my milk is in and baby seems more satisfied. I plan to add extra pumping due to weight loss. Per mom waiting for Dr. Isidore Moosffice to call for F/U apt. LC suggested tomorrow. Sore nipple and engorgement prevention and tx reviewed.  Per mom has  DEBP at home  Mother informed of post-discharge support and given phone number to the lactation department, including  services for phone call assistance; out-patient appointments; and breastfeeding support group. List of other  breastfeeding resources in the community given in the handout. Encouraged mother to call for problems or concerns related to breastfeeding.    Maternal Data Has patient been taught Hand Expression?:  (per mom familiar with hand expressing ) Does the patient have breastfeeding experience prior to this delivery?: Yes  Feeding Feeding Type:  (per mom recently BF 15 mins ) Length of feed: 15 min (per mom )  LATCH Score/Interventions                Intervention(s): Breastfeeding basics reviewed     Lactation Tools Discussed/Used WIC Program: No   Consult Status Consult Status: Complete    Kathrin Greathouseorio, Torin Modica Ann 03/08/2015, 9:48 AM

## 2015-03-08 NOTE — Discharge Summary (Signed)
Obstetric Discharge Summary Reason for Admission: onset of labor Prenatal Procedures: ultrasound Intrapartum Procedures: spontaneous vaginal delivery Postpartum Procedures: none Complications-Operative and Postpartum: none HEMOGLOBIN  Date Value Ref Range Status  03/06/2015 11.5* 12.0 - 15.0 g/dL Final   HCT  Date Value Ref Range Status  03/06/2015 34.3* 36.0 - 46.0 % Final    Physical Exam:  General: alert and cooperative Lochia: appropriate Uterine Fundus: firm Incision: perineum intact DVT Evaluation: No evidence of DVT seen on physical exam. Negative Homan's sign. No cords or calf tenderness. No significant calf/ankle edema.  Discharge Diagnoses: Term Pregnancy-delivered  Discharge Information: Date: 03/08/2015 Activity: pelvic rest Diet: routine Medications: PNV and Ibuprofen Condition: stable Instructions: refer to practice specific booklet Discharge to: home   Newborn Data: Live born female  Birth Weight: 7 lb 13.9 oz (3569 g) APGAR: 8, 9  Home with mother.  CURTIS,CAROL G 03/08/2015, 8:21 AM

## 2016-03-10 DIAGNOSIS — F411 Generalized anxiety disorder: Secondary | ICD-10-CM | POA: Insufficient documentation

## 2016-03-10 DIAGNOSIS — J301 Allergic rhinitis due to pollen: Secondary | ICD-10-CM | POA: Insufficient documentation

## 2016-03-10 DIAGNOSIS — Z309 Encounter for contraceptive management, unspecified: Secondary | ICD-10-CM | POA: Insufficient documentation

## 2016-07-19 IMAGING — US US OB DETAIL+14 WK
1 series · 12 of 28 positions shown · non-contrast
Comparison: none

[Series 1: us ob detail+14 wk · 0.21mm/px · 12 of 76 slices shown]
[im 3/76]
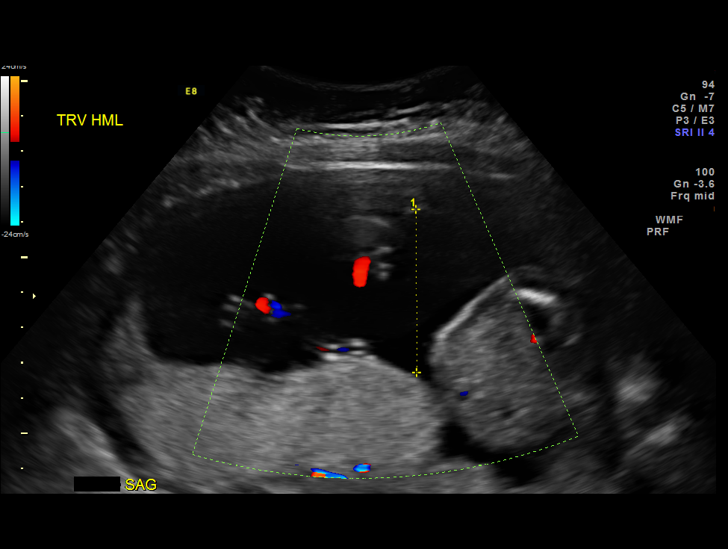
[im 9/76]
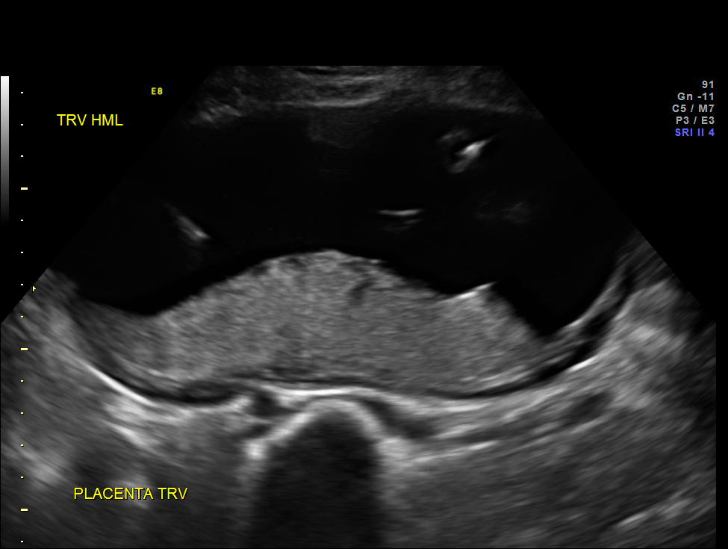
[im 14/76]
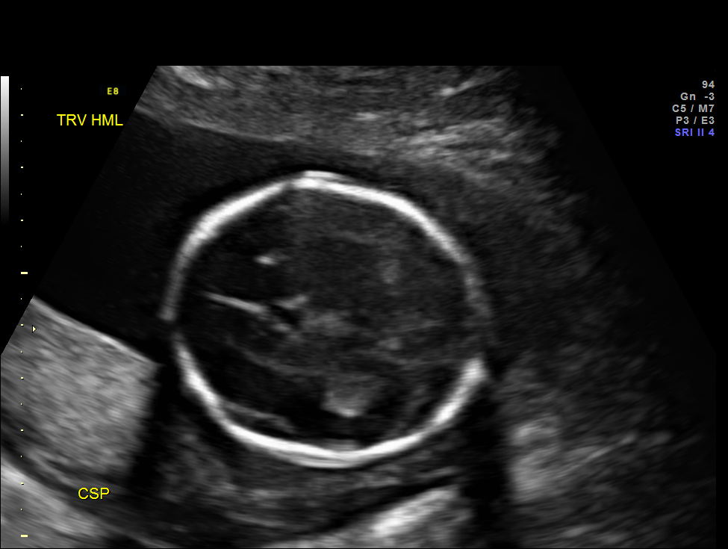
[im 23/76]
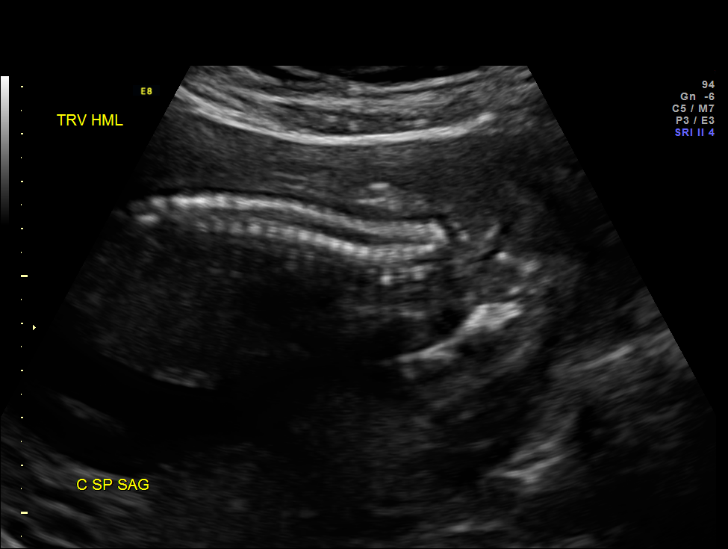
[im 28/76]
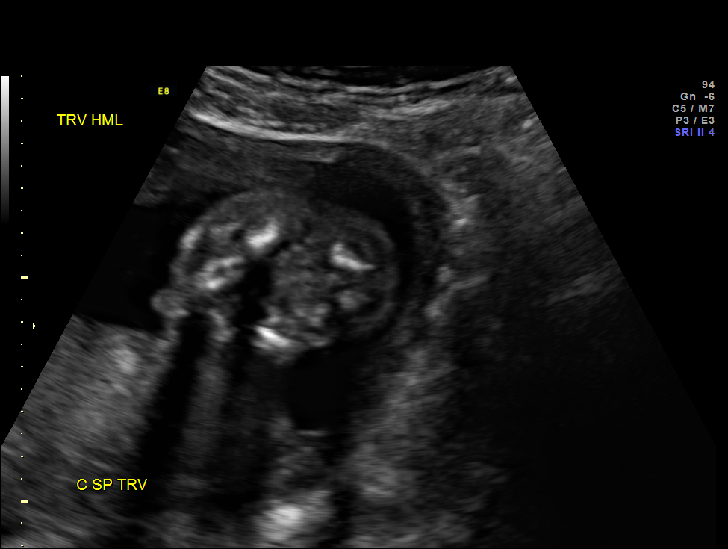
[im 34/76]
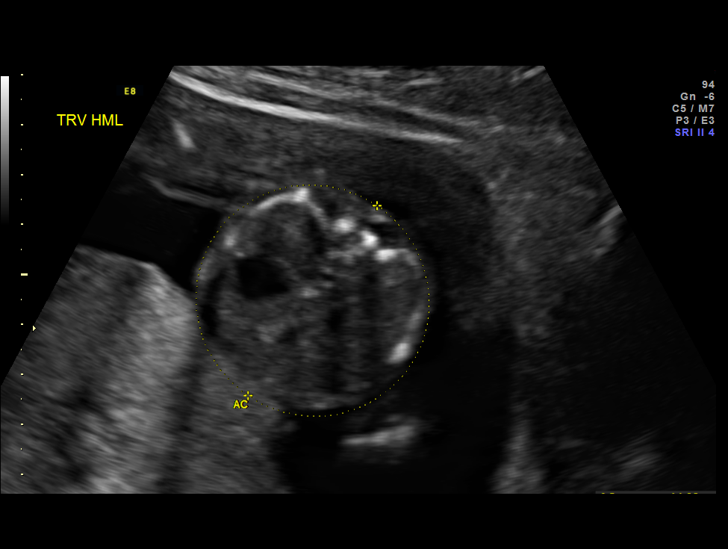
[im 42/76]
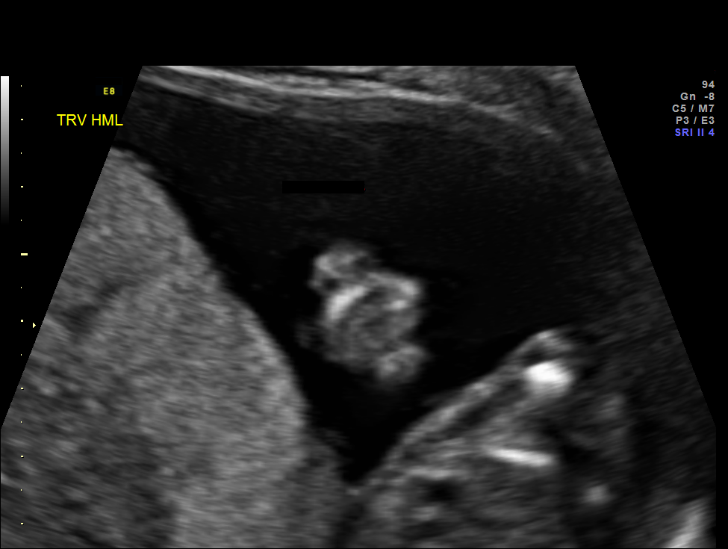
[im 48/76]
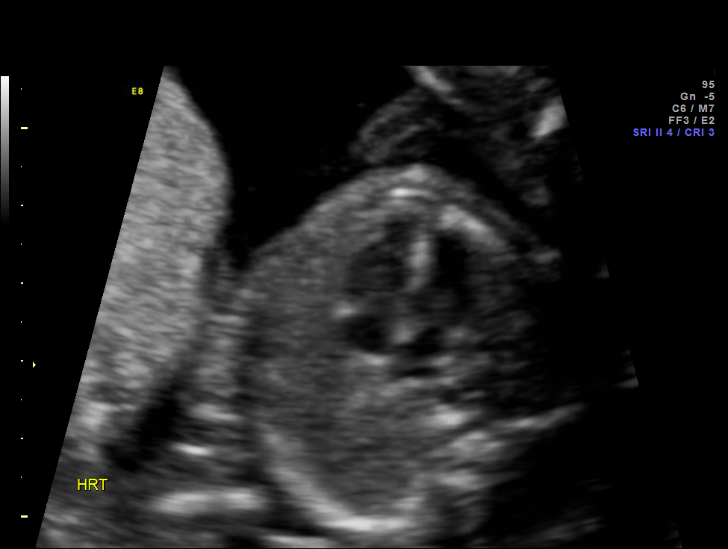
[im 53/76]
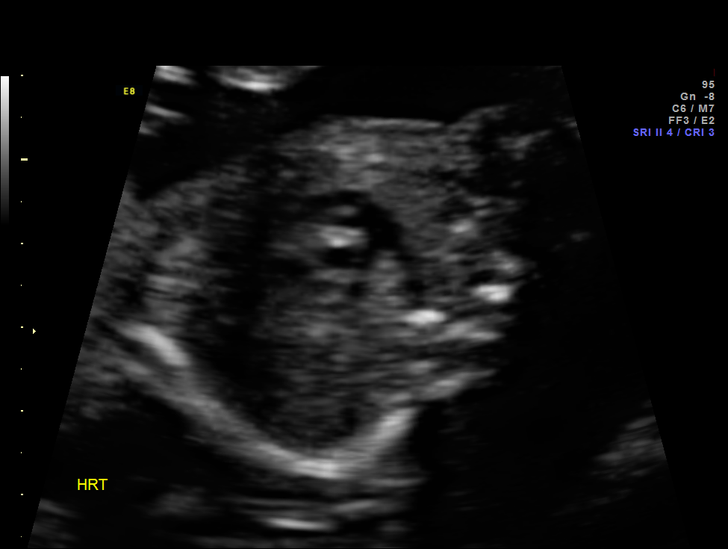
[im 62/76]
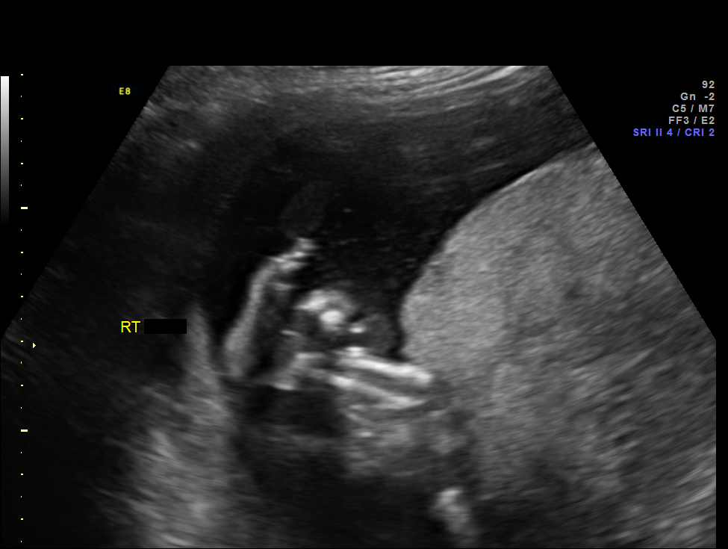
[im 67/76]
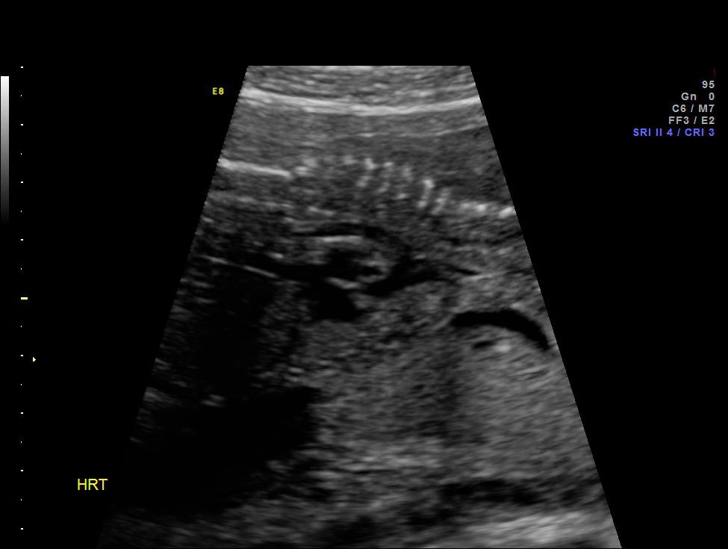
[im 73/76]
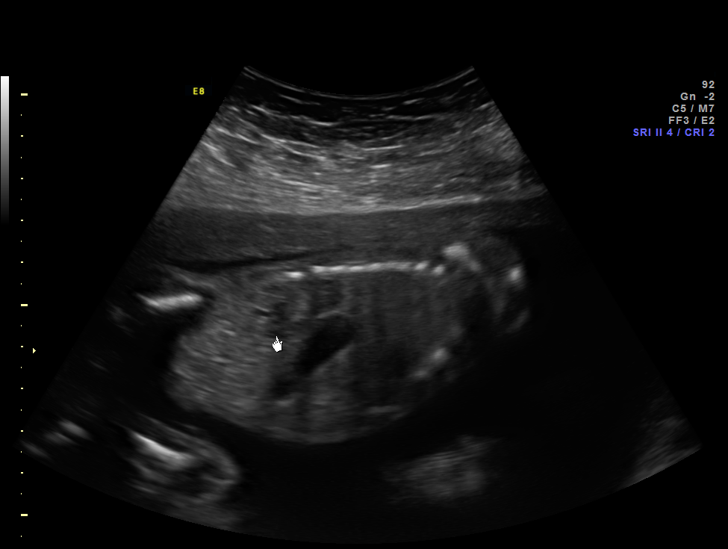

[12 of 28 positions shown; findings below may reference images not displayed]

OBSTETRICS REPORT
                      (Signed Final 10/27/2014 [DATE])

Service(s) Provided

 US OB DETAIL + 14 WK                                  76811.0
Indications

 Detailed fetal anatomic survey                        Z36
 Abnormal first trimester screen ([facility] [DATE]) - low
 risk NIPS
 Choroid plexus cyst
 20 weeks gestation of pregnancy
Fetal Evaluation

 Num Of Fetuses:    1
 Fetal Heart Rate:  147                          bpm
 Cardiac Activity:  Observed
 Presentation:      Transverse, head to
                    maternal left
 Placenta:          Posterior, above cervical
                    os
 P. Cord            Marginal insertion
 Insertion:

 Amniotic Fluid
 AFI FV:      Subjectively within normal limits
                                             Larg Pckt:     4.6  cm
Biometry

 BPD:     50.6  mm     G. Age:  21w 2d                CI:         78.3   70 - 86
 OFD:     64.6  mm                                    FL/HC:      18.3   16.8 -

 HC:     185.1  mm     G. Age:  20w 6d       67  %    HC/AC:      1.21   1.09 -

 AC:     153.6  mm     G. Age:  20w 4d       53  %    FL/BPD:
 FL:      33.9  mm     G. Age:  20w 5d       54  %    FL/AC:      22.1   20 - 24
 HUM:       32  mm     G. Age:  20w 5d       63  %
 CER:       21  mm     G. Age:  20w 0d       43  %

 Est. FW:     368  gm    0 lb 13 oz      53  %
Gestational Age

 U/S Today:     20w 6d                                        EDD:   03/10/15
 Best:          20w 2d     Det. By:  Early Ultrasound         EDD:   03/14/15
                                     (07/21/14)
Anatomy

 Cranium:          Appears normal         Aortic Arch:      Appears normal
 Fetal Cavum:      Appears normal         Ductal Arch:      Appears normal
 Ventricles:       Appears normal         Diaphragm:        Appears normal
 Choroid Plexus:   Bilateral choroid      Stomach:          Appears normal, left
                   plexus cysts
                                                            sided
 Cerebellum:       Appears normal         Abdomen:          Appears normal
 Posterior Fossa:  Appears normal         Abdominal Wall:   Appears nml (cord
                                                            insert, abd wall)
 Nuchal Fold:      Not applicable (>20    Cord Vessels:     Appears normal (3
                   wks GA)                                  vessel cord)
 Face:             Appears normal         Kidneys:          Appear normal
                   (orbits and profile)
 Lips:             Appears normal         Bladder:          Appears normal
 Heart:            Appears normal         Spine:            Appears normal
                   (4CH, axis, and
                   situs)
 RVOT:             Appears normal         Lower             Appears normal
                                          Extremities:
 LVOT:             Appears normal         Upper             Appears normal
                                          Extremities:

 Other:  Fetus appears to be a male. Heels visualized. Nasal bone visualized.
Targeted Anatomy

 Fetal Central Nervous System
 Cisterna Magna:
Cervix Uterus Adnexa

 Cervical Length:    3        cm

 Cervix:       Normal appearance by transabdominal scan.

 Left Ovary:    Not visualized.
 Right Ovary:   Within normal limits.
 Adnexa:     No abnormality visualized.
Impression

 SIUP at 20+2 weeks
 Normal detailed fetal anatomy
 Markers of aneuploidy: bilateral CP cysts; no other stigmata
 of aneuploidy (T18)
 Normal amniotic fluid volume
 Measurements consistent with prior US

 The US findings were shared with Ms. Quirijn. The
 implications of CPCs were discussed in detail. Ms. Quirijn
 reported feeling very reassured after today's US results. She
 declined genetic counseling.
Recommendations

 Follow-up as clinically indicated

 questions or concerns.

## 2016-10-29 DIAGNOSIS — K219 Gastro-esophageal reflux disease without esophagitis: Secondary | ICD-10-CM | POA: Insufficient documentation

## 2016-10-31 DIAGNOSIS — K760 Fatty (change of) liver, not elsewhere classified: Secondary | ICD-10-CM | POA: Insufficient documentation

## 2019-09-28 ENCOUNTER — Other Ambulatory Visit: Payer: Self-pay | Admitting: Cardiology

## 2019-09-28 DIAGNOSIS — Z20822 Contact with and (suspected) exposure to covid-19: Secondary | ICD-10-CM

## 2019-09-29 LAB — NOVEL CORONAVIRUS, NAA: SARS-CoV-2, NAA: NOT DETECTED

## 2023-08-01 ENCOUNTER — Ambulatory Visit: Payer: BC Managed Care – PPO | Admitting: Family Medicine

## 2023-08-01 ENCOUNTER — Other Ambulatory Visit (HOSPITAL_COMMUNITY)
Admission: RE | Admit: 2023-08-01 | Discharge: 2023-08-01 | Disposition: A | Discharge status: Home / Self Care | Payer: BC Managed Care – PPO | Source: Ambulatory Visit | Attending: Family Medicine | Admitting: Family Medicine

## 2023-08-01 ENCOUNTER — Encounter: Payer: Self-pay | Admitting: Family Medicine

## 2023-08-01 VITALS — BP 133/83 | HR 111 | Ht 68.0 in | Wt 200.0 lb

## 2023-08-01 DIAGNOSIS — Z01419 Encounter for gynecological examination (general) (routine) without abnormal findings: Secondary | ICD-10-CM | POA: Diagnosis not present

## 2023-08-01 DIAGNOSIS — Z124 Encounter for screening for malignant neoplasm of cervix: Secondary | ICD-10-CM

## 2023-08-01 DIAGNOSIS — Z1339 Encounter for screening examination for other mental health and behavioral disorders: Secondary | ICD-10-CM | POA: Diagnosis not present

## 2023-08-01 DIAGNOSIS — N939 Abnormal uterine and vaginal bleeding, unspecified: Secondary | ICD-10-CM

## 2023-08-01 DIAGNOSIS — N870 Mild cervical dysplasia: Secondary | ICD-10-CM | POA: Insufficient documentation

## 2023-08-01 DIAGNOSIS — Z3041 Encounter for surveillance of contraceptive pills: Secondary | ICD-10-CM

## 2023-08-01 NOTE — Assessment & Plan Note (Signed)
On TriSprintec for cycle control, husband has had a vasectomy. Does not need refill.

## 2023-08-01 NOTE — Patient Instructions (Signed)

## 2023-08-01 NOTE — Progress Notes (Signed)
Subjective:     Vickie Olson is a 37 y.o. female and is here for a comprehensive physical exam. 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   The following portions of the patient's history were reviewed and updated as appropriate: allergies, current medications, past family history, past medical history, past social history, past surgical history, and problem list.  Review of Systems Pertinent items noted in HPI and remainder of comprehensive ROS otherwise negative.   Objective:  Chaperone present for exam   BP 133/83   Pulse (!) 111   Ht 5\' 8"  (1.727 m)   Wt 200 lb (90.7 kg)   LMP 07/30/2023 (Exact Date)   BMI 30.41 kg/m  General appearance: alert, cooperative, and appears stated age Head: Normocephalic, without obvious abnormality, atraumatic Neck: no adenopathy, supple, symmetrical, trachea midline, and thyroid not enlarged, symmetric, no tenderness/mass/nodules Lungs: clear to auscultation bilaterally Breasts: normal appearance, no masses or tenderness Heart: regular rate and rhythm, S1, S2 normal, no murmur, click, rub or gallop Abdomen: soft, non-tender; bowel sounds normal; no masses,  no organomegaly Pelvic: cervix normal in appearance, external genitalia normal, no adnexal masses or tenderness, no cervical motion tenderness, uterus normal size, shape, and consistency, and vagina normal without discharge Extremities: extremities normal, atraumatic, no cyanosis or edema Pulses: 2+ and symmetric Skin: Skin color, texture, turgor normal. No rashes or lesions Lymph nodes: Cervical, supraclavicular, and axillary nodes normal. Neurologic: Grossly normal    Assessment:    Healthy female exam.      Plan:   Problem List Items Addressed This Visit       Unprioritized   Encounter for contraceptive management    On TriSprintec for cycle control, husband has had a vasectomy. Does not need refill.      Dysplasia of cervix, low grade  (CIN 1)    Pap with HPV today      Relevant Orders   Cytology - PAP   Other Visit Diagnoses     Encounter for gynecological examination without abnormal finding    -  Primary   declines flu shot today      Has PCP for labs, meds refills.   See After Visit Summary for Counseling Recommendations

## 2023-08-01 NOTE — Assessment & Plan Note (Signed)
-  Pap with HPV today

## 2023-08-01 NOTE — Progress Notes (Signed)
Patient presents for Annual.  LMP: 07/30/23 Last pap: Date: 01/17/22 +HPV  Hx of Colpo. 2022 pap NILM + HRHPV, 2023 pap NILM +HRHPV . Contraception: OCP Husband has vasectomy  Mammogram: Not yet indicated Mom recently dx in 02/2023 just finished radiation.  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

## 2023-08-13 LAB — CYTOLOGY - PAP
Comment: NEGATIVE
Diagnosis: NEGATIVE
Diagnosis: REACTIVE
High risk HPV: NEGATIVE

## 2024-06-02 ENCOUNTER — Encounter: Payer: Self-pay | Admitting: Family Medicine

## 2024-06-02 DIAGNOSIS — F411 Generalized anxiety disorder: Secondary | ICD-10-CM

## 2024-06-02 DIAGNOSIS — Z3041 Encounter for surveillance of contraceptive pills: Secondary | ICD-10-CM

## 2024-06-03 MED ORDER — SERTRALINE HCL 50 MG PO TABS: 50.0000 mg | ORAL_TABLET | Freq: Every day | ORAL | 3 refills | Status: AC

## 2024-06-03 MED ORDER — HYDROXYZINE HCL 25 MG PO TABS: 25.0000 mg | ORAL_TABLET | Freq: Three times a day (TID) | ORAL | 3 refills | Status: AC | PRN

## 2024-06-03 MED ORDER — TRI-SPRINTEC 0.18/0.215/0.25 MG-35 MCG PO TABS: 1.0000 | ORAL_TABLET | Freq: Every day | ORAL | 3 refills | Status: AC

## 2024-06-26 ENCOUNTER — Other Ambulatory Visit: Payer: Self-pay | Admitting: Family Medicine

## 2024-06-26 DIAGNOSIS — F411 Generalized anxiety disorder: Secondary | ICD-10-CM
